# Patient Record
Sex: Female | Born: 1970 | Race: Black or African American | Hispanic: No | Marital: Single | Smoking: Former smoker
Health system: Southern US, Community
[De-identification: ages and names within clinical notes are randomized; demographics above are authoritative.]

## PROBLEM LIST (undated history)

## (undated) HISTORY — PX: TUBAL LIGATION: SHX77

---

## 2017-03-22 ENCOUNTER — Emergency Department: Payer: Self-pay

## 2017-03-22 ENCOUNTER — Encounter: Payer: Self-pay | Admitting: Emergency Medicine

## 2017-03-22 ENCOUNTER — Emergency Department
Admission: EM | Admit: 2017-03-22 | Discharge: 2017-03-22 | Disposition: A | Discharge status: Home / Self Care | Payer: Self-pay | Attending: Emergency Medicine | Admitting: Emergency Medicine

## 2017-03-22 DIAGNOSIS — F17210 Nicotine dependence, cigarettes, uncomplicated: Secondary | ICD-10-CM | POA: Insufficient documentation

## 2017-03-22 DIAGNOSIS — R079 Chest pain, unspecified: Secondary | ICD-10-CM | POA: Insufficient documentation

## 2017-03-22 DIAGNOSIS — Z5321 Procedure and treatment not carried out due to patient leaving prior to being seen by health care provider: Secondary | ICD-10-CM | POA: Insufficient documentation

## 2017-03-22 LAB — BASIC METABOLIC PANEL
ANION GAP: 9 (ref 5–15)
BUN: 16 mg/dL (ref 6–20)
CO2: 25 mmol/L (ref 22–32)
Calcium: 9 mg/dL (ref 8.9–10.3)
Chloride: 104 mmol/L (ref 101–111)
Creatinine, Ser: 0.85 mg/dL (ref 0.44–1.00)
GFR calc Af Amer: >60 mL/min (ref >60)
GFR calc non Af Amer: >60 mL/min (ref >60)
Glucose, Bld: 87 mg/dL (ref 65–99)
POTASSIUM: 3.6 mmol/L (ref 3.5–5.1)
Sodium: 138 mmol/L (ref 135–145)

## 2017-03-22 LAB — CBC
HEMATOCRIT: 41.1 % (ref 35.0–47.0)
HEMOGLOBIN: 13.7 g/dL (ref 12.0–16.0)
MCH: 28.1 pg (ref 26.0–34.0)
MCHC: 33.2 g/dL (ref 32.0–36.0)
MCV: 84.6 fL (ref 80.0–100.0)
Platelets: 238 10*3/uL (ref 150–440)
RBC: 4.86 MIL/uL (ref 3.80–5.20)
RDW: 13.6 % (ref 11.5–14.5)
WBC: 6.5 10*3/uL (ref 3.6–11.0)

## 2017-03-22 LAB — TROPONIN I: Troponin I: <0.03 ng/mL (ref <0.03)

## 2017-03-22 NOTE — ED Notes (Signed)
No answer when called from lobby 

## 2017-03-23 ENCOUNTER — Telehealth: Payer: Self-pay | Admitting: Emergency Medicine

## 2017-03-23 NOTE — Telephone Encounter (Signed)
Called patient due to lwot to inquire about condition and follow up plans. Left message.   

## 2017-07-31 ENCOUNTER — Emergency Department: Payer: Self-pay

## 2017-07-31 DIAGNOSIS — R002 Palpitations: Secondary | ICD-10-CM | POA: Insufficient documentation

## 2017-07-31 DIAGNOSIS — R0602 Shortness of breath: Secondary | ICD-10-CM | POA: Insufficient documentation

## 2017-07-31 DIAGNOSIS — Z87891 Personal history of nicotine dependence: Secondary | ICD-10-CM | POA: Insufficient documentation

## 2017-07-31 LAB — CBC
HCT: 37.3 % (ref 35.0–47.0)
Hemoglobin: 12.5 g/dL (ref 12.0–16.0)
MCH: 28.5 pg (ref 26.0–34.0)
MCHC: 33.6 g/dL (ref 32.0–36.0)
MCV: 84.9 fL (ref 80.0–100.0)
PLATELETS: 204 10*3/uL (ref 150–440)
RBC: 4.39 MIL/uL (ref 3.80–5.20)
RDW: 14.1 % (ref 11.5–14.5)
WBC: 7 10*3/uL (ref 3.6–11.0)

## 2017-07-31 LAB — TROPONIN I

## 2017-07-31 LAB — BASIC METABOLIC PANEL
ANION GAP: 7 (ref 5–15)
BUN: 14 mg/dL (ref 6–20)
CALCIUM: 8.7 mg/dL — AB (ref 8.9–10.3)
CHLORIDE: 107 mmol/L (ref 101–111)
CO2: 24 mmol/L (ref 22–32)
Creatinine, Ser: 0.74 mg/dL (ref 0.44–1.00)
GLUCOSE: 99 mg/dL (ref 65–99)
Potassium: 3.9 mmol/L (ref 3.5–5.1)
Sodium: 138 mmol/L (ref 135–145)

## 2017-07-31 NOTE — ED Notes (Signed)
Pt says she was laying down when she began feeling like her heart was racing; ambulatory with steady gait; after stat registration, pt went to the bathroom to void

## 2017-07-31 NOTE — ED Triage Notes (Signed)
Patient reports earlier episode of palpitations and SOB. Patient reports palpitations have resolved, however reports continued SOB.

## 2017-08-01 ENCOUNTER — Emergency Department
Admission: EM | Admit: 2017-08-01 | Discharge: 2017-08-01 | Disposition: A | Discharge status: Home / Self Care | Payer: Self-pay | Attending: Emergency Medicine | Admitting: Emergency Medicine

## 2017-08-01 DIAGNOSIS — R002 Palpitations: Secondary | ICD-10-CM

## 2017-08-01 LAB — FIBRIN DERIVATIVES D-DIMER (ARMC ONLY): FIBRIN DERIVATIVES D-DIMER (ARMC): 302.78 (ref 0.00–499.00)

## 2017-08-01 NOTE — ED Notes (Signed)
Per dr. Manson Passey he would like to hold pt at this time to monitor HR and ekg. Pt informed of delay for discharge. Pt assisted up to commode to void. Pt verbalizes understanding.

## 2017-08-01 NOTE — ED Provider Notes (Signed)
[facility] Emergency Department Provider Note   First MD Initiated Contact with Patient 08/01/17 0142     (approximate)  I have reviewed the triage vital signs and the nursing notes.   HISTORY  Chief Complaint Palpitations   HPI Amy Velez is a 46 y.o. female resents emergency department with history of painless palpitations and shortness of breath which occurred at 10:30 tonight. Patient states she's had previous episodes of palpitations and dyspnea however she is markedly concerned regarding the episode tonight because it lasted longer than usual. She said the palpitations were quick however the dyspnea persisted for an additional 30 minutes. Again patient denies any chest pain no dizziness no nausea or vomiting. Patient has no symptoms at present   Past medical history None There are no active problems to display for this patient.   Past Surgical History:  Procedure Laterality Date  . CESAREAN SECTION      Prior to Admission medications   Not on File    Allergies no known drug allergies No family history on file.  Social History Social History  Substance Use Topics  . Smoking status: Former Smoker    Packs/day: 1.00    Types: Cigarettes  . Smokeless tobacco: Never Used  . Alcohol use No    Review of Systems Constitutional: No fever/chills Eyes: No visual changes. ENT: No sore throat. Cardiovascular: Denies chest pain.positive for palpitations Respiratory: positive forshortness of breath. Gastrointestinal: No abdominal pain.  No nausea, no vomiting.  No diarrhea.  No constipation. Genitourinary: Negative for dysuria. Musculoskeletal: Negative for neck pain.  Negative for back pain. Integumentary: Negative for rash. Neurological: Negative for headaches, focal weakness or numbness.   ____________________________________________   PHYSICAL EXAM:  VITAL SIGNS: ED Triage Vitals  Enc Vitals Group     BP 07/31/17 2238 (!)  141/76     Pulse Rate 07/31/17 2238 64     Resp 07/31/17 2238 18     Temp 07/31/17 2238 98 F (36.7 C)     Temp Source 07/31/17 2238 Oral     SpO2 07/31/17 2238 100 %     Weight 07/31/17 2239 81.6 kg (180 lb)     Height 07/31/17 2239 1.676 m ( )     Head Circumference --      Peak Flow --      Pain Score --      Pain Loc --      Pain Edu? --      Excl. in GC? --     Constitutional: Alert and oriented. Well appearing and in no acute distress. Eyes: Conjunctivae are normal.  Head: Atraumatic. Mouth/Throat: Mucous membranes are moist.  Oropharynx non-erythematous. Neck: No stridor.   Cardiovascular: Normal rate, regular rhythm. Good peripheral circulation. Grossly normal heart sounds. Respiratory: Normal respiratory effort.  No retractions. Lungs CTAB. Gastrointestinal: Soft and nontender. No distention.  Musculoskeletal: No lower extremity tenderness nor edema. No gross deformities of extremities. Neurologic:  Normal speech and language. No gross focal neurologic deficits are appreciated.  Skin:  Skin is warm, dry and intact. No rash noted. Psychiatric: Mood and affect are normal. Speech and behavior are normal.  ____________________________________________   LABS (all labs ordered are listed, but only abnormal results are displayed)  Labs Reviewed  BASIC METABOLIC PANEL - Abnormal; Notable for the following:       Result Value   Calcium 8.7 (*)    All other components within normal limits  CBC  TROPONIN I  FIBRIN DERIVATIVES D-DIMER ([facility] ONLY)   ____________________________________________  EKG  ED ECG REPORT I, [place] N BROWN, the attending physician, personally viewed and interpreted this ECG.   Date: 08/01/2017  EKG Time: 10:32 PM  Rate: 61  Rhythm: normal sinus rhythm  Axis: normal  Intervals:Normal  ST&T Change: None  ____________________________________________  RADIOLOGY I, [place] N BROWN, personally viewed and evaluated these images (plain  radiographs) as part of my medical decision making, as well as reviewing the written report by the radiologist.  Final result by Adrian Prows, MD (07/31/17 23:10:50)           Narrative:   CLINICAL DATA: Palpitations  EXAM: CHEST 2 VIEW  COMPARISON: 03/22/2017  FINDINGS: The heart size and mediastinal contours are within normal limits. Both lungs are clear. The visualized skeletal structures are unremarkable.  IMPRESSION: No active cardiopulmonary disease.   Electronically Signed By: Jasmine Pang M.D. On: 07/31/2017 23:10            Procedures   ____________________________________________   INITIAL IMPRESSION / ASSESSMENT AND PLAN / ED COURSE  Pertinent labs & imaging results that were available during my care of the patient were reviewed by me and considered in my medical decision making (see chart for details).   46 year old female presenting with above stated history of physical exam of heart palpitation company by dyspnea. Patient normal sinus rhythm without ectopywhile in the emergency department . Patient is symptomatically at present and a such a unclear etiology for the patient's palpitation dyspnea. Consider the possibility of pulmonary emboli however d-dimer negative troponin likewise negative EKG revealed no evidence of ischemia or infarction. Patient be referred to Dr. Vennie Homans cardiologist for further outpatient evaluation      ____________________________________________  FINAL CLINICAL IMPRESSION(S) / ED DIAGNOSES  Final diagnoses:  Heart palpitations     MEDICATIONS GIVEN DURING THIS VISIT:  Medications - No data to display   NEW OUTPATIENT MEDICATIONS STARTED DURING THIS VISIT:  New Prescriptions   No medications on file    Modified Medications   No medications on file    Discontinued Medications   No medications on file     Note:  This document was prepared using Dragon voice recognition software and may  include unintentional dictation errors.    Darci Current, MD 08/01/17 [phone]

## 2017-08-01 NOTE — ED Notes (Signed)
Pt states was at rest this pm when she began to experience sensation of palpitations and "heavy beating" of her heart. Pt states that lasted "for a few beats", but pt felt short of breath for approx 30 minutes post onset of sensation. Pt denies tobacco use, bcp or hormone usage, long periods of station, cough, dizziness, diaphoresis, pain, syncope, near syncope, cough. Pt denies recent illness, fever. Pt states she has been under stress recently.

## 2017-08-01 NOTE — ED Notes (Signed)
Warm blankets provided, call bell at right side. Lights dimmed for comfort.

## 2017-10-11 IMAGING — CR DG CHEST 2V
1 series · 2 of 2 positions shown · non-contrast
Comparison: None.

CLINICAL DATA: Chest pain and shortness of Breath

EXAM:
CHEST  2 VIEW

[Series 1: w chest pa · 0.14mm/px · 2 of 2 slices shown]
[im 1/2]
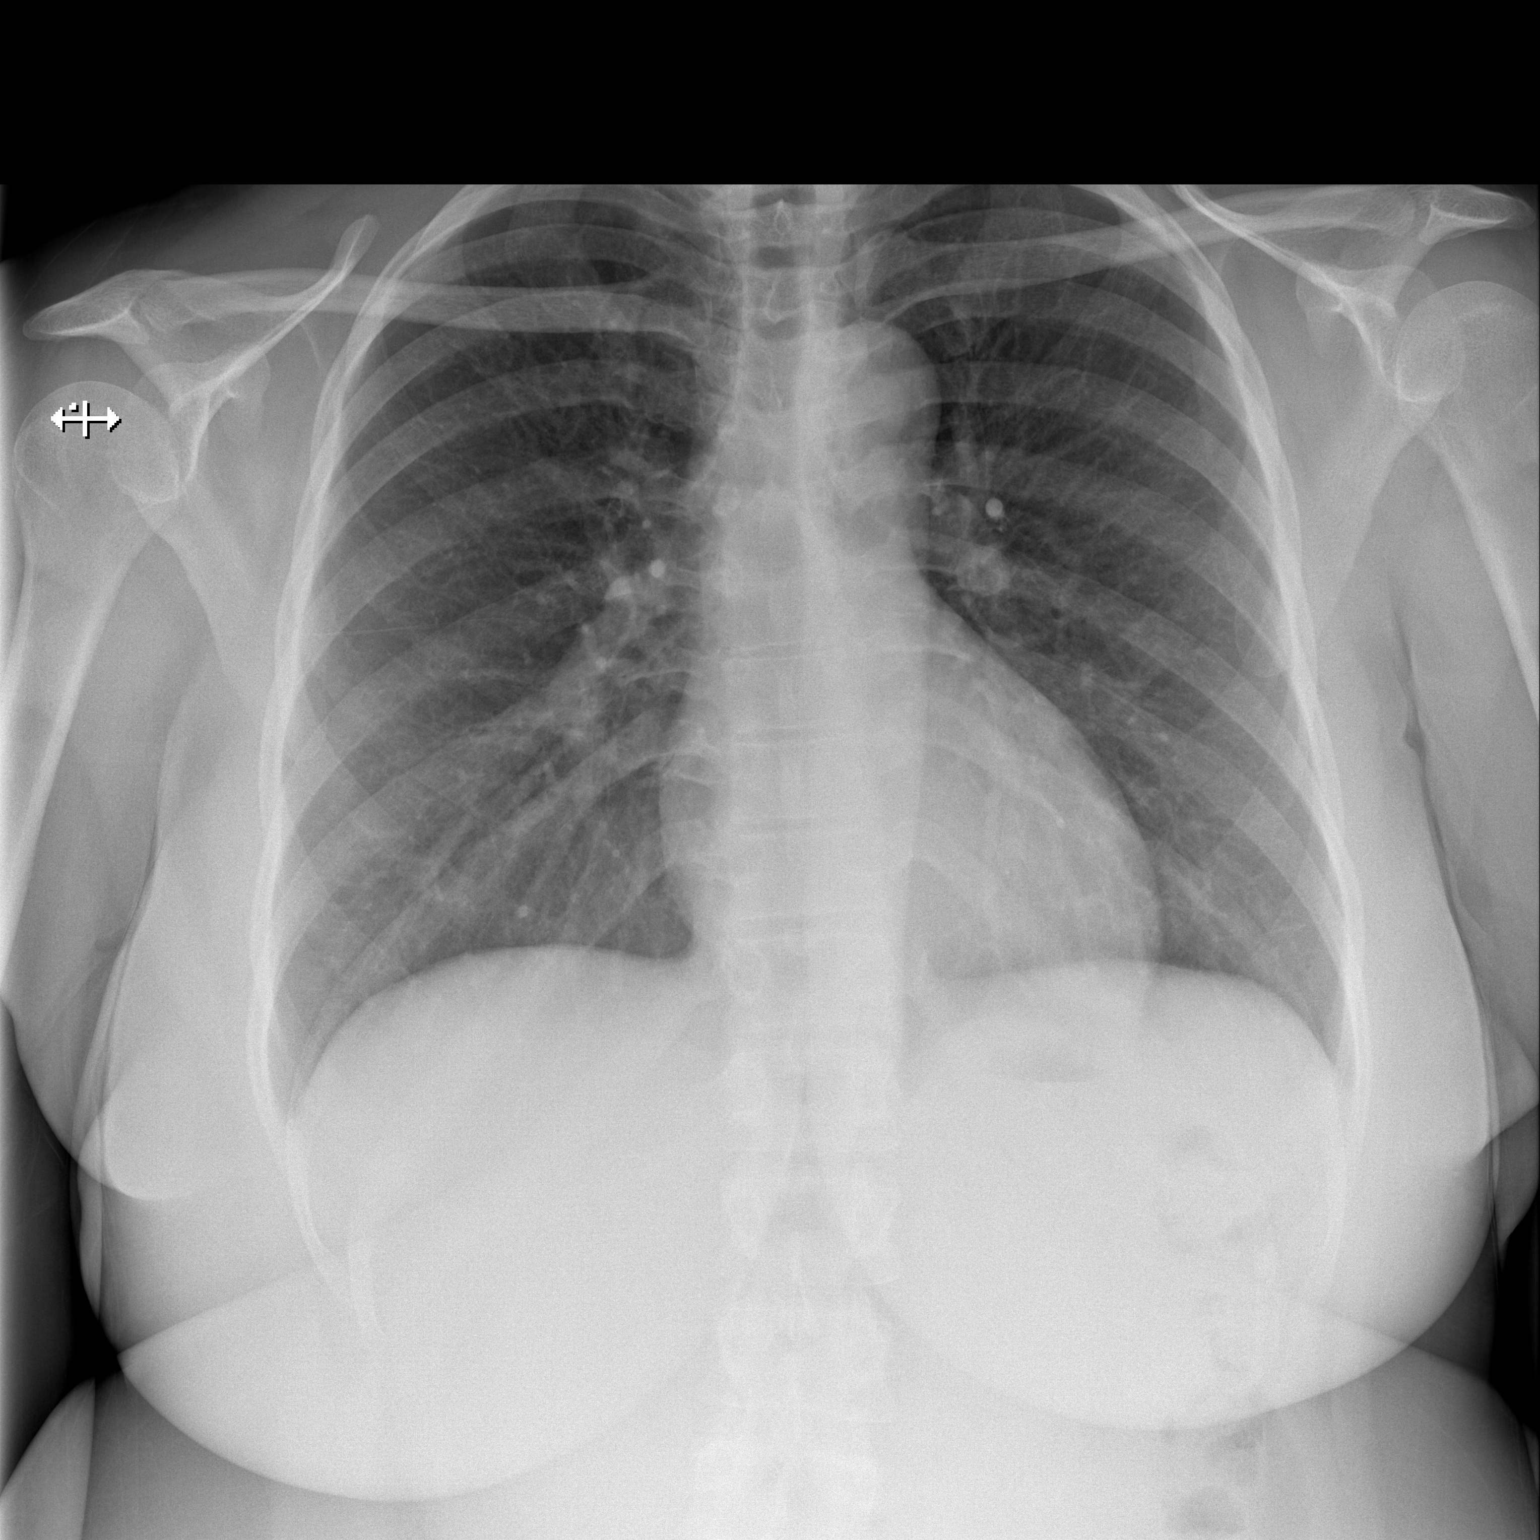
[im 2/2]
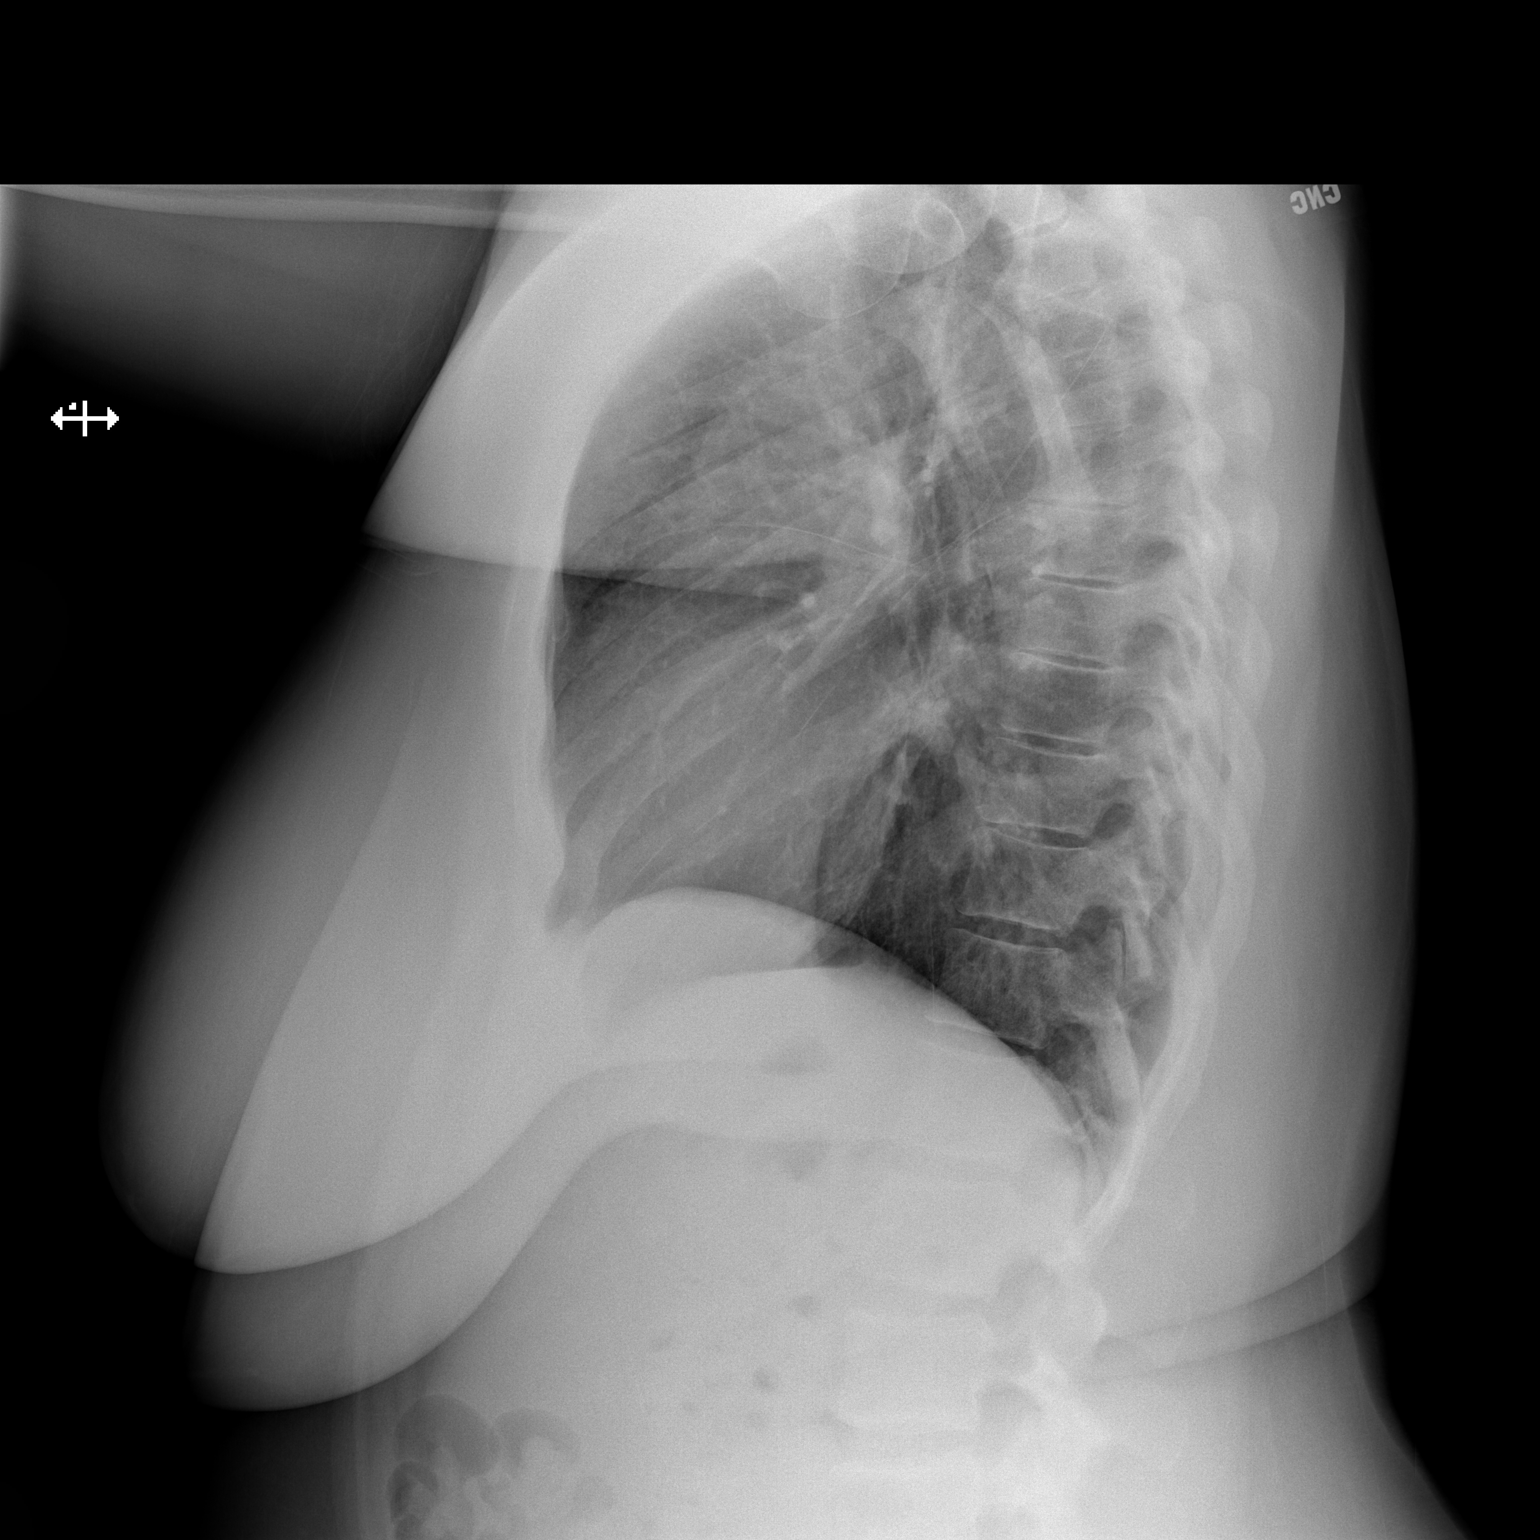

[2 of 2 positions shown; findings below may reference images not displayed]

FINDINGS: The heart size and mediastinal contours are within normal limits.
Both lungs are clear. The visualized skeletal structures are
unremarkable.
IMPRESSION: No active cardiopulmonary disease.

## 2018-01-05 ENCOUNTER — Encounter: Payer: Self-pay | Admitting: Physician Assistant

## 2018-01-05 ENCOUNTER — Ambulatory Visit: Payer: 59 | Admitting: Physician Assistant

## 2018-01-05 VITALS — BP 122/78 | HR 60 | Temp 98.4°F | Resp 16 | Ht 66.0 in | Wt 204.0 lb

## 2018-01-05 DIAGNOSIS — Z124 Encounter for screening for malignant neoplasm of cervix: Secondary | ICD-10-CM

## 2018-01-05 DIAGNOSIS — Z13220 Encounter for screening for lipoid disorders: Secondary | ICD-10-CM

## 2018-01-05 DIAGNOSIS — Z114 Encounter for screening for human immunodeficiency virus [HIV]: Secondary | ICD-10-CM

## 2018-01-05 DIAGNOSIS — Z131 Encounter for screening for diabetes mellitus: Secondary | ICD-10-CM | POA: Diagnosis not present

## 2018-01-05 DIAGNOSIS — Z0000 Encounter for general adult medical examination without abnormal findings: Secondary | ICD-10-CM

## 2018-01-05 DIAGNOSIS — Z1329 Encounter for screening for other suspected endocrine disorder: Secondary | ICD-10-CM | POA: Diagnosis not present

## 2018-01-05 DIAGNOSIS — Z720 Tobacco use: Secondary | ICD-10-CM | POA: Diagnosis not present

## 2018-01-05 DIAGNOSIS — Z1239 Encounter for other screening for malignant neoplasm of breast: Secondary | ICD-10-CM

## 2018-01-05 MED ORDER — BUPROPION HCL ER (SR) 150 MG PO TB12: ORAL_TABLET | ORAL | 0 refills | Status: DC

## 2018-01-05 NOTE — Progress Notes (Signed)
Patient: Amy Velez Female    DOB: 1970-12-01   46 y.o.   MRN: [id number] Visit Date: 01/05/2018  Today's Provider: Trey SailorsAdriana M Nalayah Hitt, PA-C   Chief Complaint  Patient presents with  . Establish Care   Subjective:    Amy Velez is a 47 y/o woman presenting today for annual physical. She is living in [place] with her husband. She has three grown children ages 6319-27 who are in school or working.   She does smoke, one pack per day and is interested in quitting.   She doesn't have an health complaints.   She reports she had a PAP smear two years ago, thinks results were normal. Doesn't remember where she got it. Sinusitis  This is a new problem. The current episode started yesterday. The problem has been gradually worsening since onset. Associated symptoms include chills, congestion, headaches and sinus pressure. Pertinent negatives include no diaphoresis, ear pain, neck pain, shortness of breath, sneezing, sore throat or swollen glands. Past treatments include nothing. The treatment provided no relief.       No Known Allergies  No current outpatient medications on file.  Review of Systems  Constitutional: Positive for chills and fatigue. Negative for activity change, appetite change, diaphoresis, fever and unexpected weight change.  HENT: Positive for congestion, sinus pressure and sinus pain. Negative for ear discharge, ear pain, nosebleeds, postnasal drip, rhinorrhea, sneezing, sore throat, tinnitus, trouble swallowing and voice change.   Eyes: Positive for discharge and itching. Negative for photophobia, pain, redness and visual disturbance.  Respiratory: Negative.  Negative for shortness of breath.   Gastrointestinal: Positive for nausea. Negative for abdominal distention, abdominal pain, anal bleeding, blood in stool, constipation, diarrhea, rectal pain and vomiting.  Musculoskeletal: Positive for myalgias. Negative for arthralgias, back pain, gait problem,  joint swelling, neck pain and neck stiffness.  Neurological: Positive for headaches. Negative for dizziness and light-headedness.    Social History   Tobacco Use  . Smoking status: Current Every Day Smoker    Packs/day: 1.00    Types: Cigarettes  . Smokeless tobacco: Never Used  Substance Use Topics  . Alcohol use: No   Objective:   BP 122/78 (BP Location: Right Arm, Patient Position: Sitting, Cuff Size: Large)   Pulse 60   Temp 98.4 F (36.9 C) (Oral)   Resp 16   Ht 5\' 6"  (1.676 m)   Wt 204 lb (92.5 kg)   LMP 12/31/2017   SpO2 98%   BMI 32.93 kg/m  Vitals:   01/05/18 0921  BP: 122/78  Pulse: 60  Resp: 16  Temp: 98.4 F (36.9 C)  TempSrc: Oral  SpO2: 98%  Weight: 204 lb (92.5 kg)  Height: 5\' 6"  (1.676 m)     Physical Exam  Constitutional: She is oriented to person, place, and time. She appears well-developed and well-nourished.  HENT:  Right Ear: External ear normal.  Left Ear: External ear normal.  Eyes: Conjunctivae are normal.  Neck: Neck supple.  Cardiovascular: Normal rate and regular rhythm.  Pulmonary/Chest: Effort normal and breath sounds normal. Right breast exhibits no inverted nipple, no mass, no nipple discharge, no skin change and no tenderness. Left breast exhibits no inverted nipple, no mass, no nipple discharge, no skin change and no tenderness. Breasts are symmetrical.  Abdominal: Soft. Bowel sounds are normal. There is no tenderness.  Genitourinary: No labial fusion. There is no rash, tenderness, lesion or injury on the right labia. There is no rash, tenderness, lesion  or injury on the left labia. Uterus is not deviated, not enlarged, not fixed and not tender. Cervix exhibits no motion tenderness, no discharge and no friability. Right adnexum displays no mass, no tenderness and no fullness. Left adnexum displays no mass, no tenderness and no fullness. No erythema, tenderness or bleeding in the vagina. No foreign body in the vagina. No signs of  injury around the vagina. Vaginal discharge found.  Lymphadenopathy:    She has no cervical adenopathy.  Neurological: She is alert and oriented to person, place, and time.  Skin: Skin is warm and dry.  Psychiatric: She has a normal mood and affect. Her behavior is normal.        Assessment & Plan:     1. Annual physical exam   2. Lipid screening  - Lipid Profile  3. Thyroid disorder screening  - TSH  4. Encounter for screening for HIV  - HIV antibody (with reflex)  5. Diabetes mellitus screening  - Comprehensive Metabolic Panel (CMET)  6. Breast cancer screening  - MM Digital Screening; Future  7. Cervical cancer screening  - Pap IG and HPV (high risk) DNA detection  8. Tobacco abuse  Counseled on smoking cessation for >3 min.  - buPROPion (WELLBUTRIN SR) 150 MG 12 hr tablet; Take one pill daily for three days. On day four, take one pill twice daily for remaining course.  Dispense: 180 tablet; Refill: 0  Return in about 1 year (around 01/05/2019).  The entirety of the information documented in the History of Present Illness, Review of Systems and Physical Exam were personally obtained by me. Portions of this information were initially documented by Kavin Leech, CMA and reviewed by me for thoroughness and accuracy.          Trey Sailors, PA-C  [facility]

## 2018-01-05 NOTE — Patient Instructions (Signed)

## 2018-01-06 ENCOUNTER — Telehealth: Payer: Self-pay

## 2018-01-06 DIAGNOSIS — Z720 Tobacco use: Secondary | ICD-10-CM | POA: Insufficient documentation

## 2018-01-06 LAB — COMPREHENSIVE METABOLIC PANEL
ALT: 13 IU/L (ref 0–32)
AST: 16 IU/L (ref 0–40)
Albumin/Globulin Ratio: 1.5 (ref 1.2–2.2)
Albumin: 4.2 g/dL (ref 3.5–5.5)
Alkaline Phosphatase: 58 IU/L (ref 39–117)
BUN/Creatinine Ratio: 15 (ref 9–23)
BUN: 10 mg/dL (ref 6–24)
Bilirubin Total: 0.3 mg/dL (ref 0.0–1.2)
CO2: 22 mmol/L (ref 20–29)
Calcium: 9.2 mg/dL (ref 8.7–10.2)
Chloride: 103 mmol/L (ref 96–106)
Creatinine, Ser: 0.66 mg/dL (ref 0.57–1.00)
GFR calc Af Amer: 123 mL/min/{1.73_m2} (ref >59)
GFR calc non Af Amer: 107 mL/min/{1.73_m2} (ref >59)
Globulin, Total: 2.8 g/dL (ref 1.5–4.5)
Glucose: 94 mg/dL (ref 65–99)
Potassium: 4.6 mmol/L (ref 3.5–5.2)
Sodium: 140 mmol/L (ref 134–144)
Total Protein: 7 g/dL (ref 6.0–8.5)

## 2018-01-06 LAB — LIPID PANEL
Chol/HDL Ratio: 2.4 ratio (ref 0.0–4.4)
Cholesterol, Total: 133 mg/dL (ref 100–199)
HDL: 56 mg/dL (ref >39)
LDL Calculated: 67 mg/dL (ref 0–99)
Triglycerides: 50 mg/dL (ref 0–149)
VLDL Cholesterol Cal: 10 mg/dL (ref 5–40)

## 2018-01-06 LAB — TSH: TSH: 0.536 u[IU]/mL (ref 0.450–4.500)

## 2018-01-06 LAB — HIV ANTIBODY (ROUTINE TESTING W REFLEX): HIV Screen 4th Generation wRfx: NONREACTIVE

## 2018-01-06 NOTE — Telephone Encounter (Signed)
-----   Message from Trey SailorsAdriana M Pollak, [place]-C sent at 01/06/2018  8:34 AM EST ----- Labwork all normal.

## 2018-01-06 NOTE — Telephone Encounter (Signed)
Pt advised.   Thanks,   -Laura  

## 2018-01-07 LAB — PAP IG AND HPV HIGH-RISK
HPV, high-risk: NEGATIVE
PAP Smear Comment: 0

## 2018-01-10 ENCOUNTER — Telehealth: Payer: Self-pay

## 2018-01-10 NOTE — Telephone Encounter (Signed)
LMTCB 01/10/2018  Thanks,   -Gordie Belvin  

## 2018-01-10 NOTE — Telephone Encounter (Signed)
-----   Message from Trey SailorsAdriana M Pollak, [place] sent at 01/10/2018  3:17 PM EST ----- PAP is normal and HPV negative. Repeat 5 years.

## 2018-01-11 NOTE — Telephone Encounter (Signed)
Pt advised.   Thanks,   -Laura  

## 2018-02-28 ENCOUNTER — Other Ambulatory Visit: Payer: Self-pay | Admitting: Physician Assistant

## 2018-02-28 DIAGNOSIS — Z720 Tobacco use: Secondary | ICD-10-CM

## 2018-03-02 ENCOUNTER — Telehealth: Payer: Self-pay | Admitting: Physician Assistant

## 2018-03-02 NOTE — Telephone Encounter (Signed)
[facility] pharmacy faxed a refill request for the following medication. Thanks CC  buPROPion (WELLBUTRIN SR) 150 MG 12 hr tablet

## 2018-04-26 ENCOUNTER — Ambulatory Visit: Payer: 59 | Admitting: Physician Assistant

## 2018-04-26 ENCOUNTER — Encounter: Payer: Self-pay | Admitting: Physician Assistant

## 2018-04-26 VITALS — BP 116/76 | HR 60 | Temp 98.6°F | Resp 16 | Wt 213.0 lb

## 2018-04-26 DIAGNOSIS — R29818 Other symptoms and signs involving the nervous system: Secondary | ICD-10-CM | POA: Diagnosis not present

## 2018-04-26 DIAGNOSIS — Z720 Tobacco use: Secondary | ICD-10-CM

## 2018-04-26 DIAGNOSIS — R0681 Apnea, not elsewhere classified: Secondary | ICD-10-CM

## 2018-04-26 DIAGNOSIS — R51 Headache: Secondary | ICD-10-CM

## 2018-04-26 DIAGNOSIS — R519 Headache, unspecified: Secondary | ICD-10-CM

## 2018-04-26 DIAGNOSIS — R252 Cramp and spasm: Secondary | ICD-10-CM | POA: Diagnosis not present

## 2018-04-26 NOTE — Progress Notes (Signed)
Patient: Amy Velez Female    DOB: February 25, 1971   47 y.o.   MRN: [id number] Visit Date: 04/29/2018  Today's Provider: Trey SailorsAdriana M Siriah Treat, PA-C   Chief Complaint  Patient presents with  . Headache    Has been going on for several months   Subjective:    Amy Velez is a 47 y/o woman with history of tobacco abuse presenting today for multiple complaints. The first of which are her headaches, which have been lifelong but particularly bothersome in the last few months. She has been seen for this on multiple occasions in the ER, most recently on [facility]09/[facility] at [facility]. She underwent head CT which was normal. Labwork unremarkable. Migraine cocktail provided relief. She does not note a significant change in headaches, does not describe migrainous features.  She also has concerns about leg cramps. They happen intermittently and are not related to activity and are not relieved with rest.  She also wants to know why she has dark eye bags.   Additionally, reports fatigue. She reports sleeping normal hours but not being rested. She says her husband reports she snores and even stops breathing at night while she sleeps. Reports daytime fatigue, propensity to dose off in the daytime. [place] completed today.   She has googled her symptoms and is worried her smoking has caused poor circulation that is causing her headaches, dark eye bags, and leg cramping. She continues to smoke one pack per day. She has been smoking one pack per day since  ~age 59. Last visit she was prescribed wellbutrin which she says did not work. She says she tried chantix previously and it did not work.   Headache   This is a chronic problem. Episode frequency: Pt reports getting a headached every other day.  The problem has been unchanged. The pain is located in the frontal region. The pain does not radiate. The pain quality is not similar to prior headaches. The quality of the pain is described as dull. Associated symptoms  include numbness (Left hand occasionally), photophobia and sinus pressure. Pertinent negatives include no abdominal pain, anorexia, blurred vision, dizziness, ear pain, eye pain, eye redness, eye watering, fever, phonophobia, rhinorrhea, seizures, sore throat or tinnitus.       No Known Allergies   Current Outpatient Medications:  .  buPROPion (WELLBUTRIN SR) 150 MG 12 hr tablet, TAKE 1 TABLET BY MOUTH  DAILY FOR 3 DAYS. ON DAY 4  TAKE 1 TABLET 2 TIMES DAILY FOR REMAINING COURSE., Disp: 180 tablet, Rfl: 0  Review of Systems  Constitutional: Positive for diaphoresis and fatigue. Negative for activity change, appetite change, chills, fever and unexpected weight change.  HENT: Positive for sinus pressure. Negative for congestion, ear discharge, ear pain, postnasal drip, rhinorrhea, sore throat, tinnitus, trouble swallowing and voice change.   Eyes: Positive for photophobia and itching. Negative for blurred vision, pain, discharge, redness and visual disturbance.  Gastrointestinal: Negative.  Negative for abdominal pain and anorexia.  Neurological: Positive for light-headedness, numbness (Left hand occasionally) and headaches. Negative for dizziness, seizures, syncope and speech difficulty.  Hematological: Does not bruise/bleed easily.    Social History   Tobacco Use  . Smoking status: Current Every Day Smoker    Packs/day: 1.00    Types: Cigarettes  . Smokeless tobacco: Never Used  Substance Use Topics  . Alcohol use: No   Objective:   BP 116/76 (BP Location: Right Arm, Patient Position: Sitting, Cuff Size: Large)   Pulse 60  Temp 98.6 F (37 C) (Oral)   Resp 16   Wt 213 lb (96.6 kg)   BMI 34.38 kg/m  Vitals:   04/26/18 1630  BP: 116/76  Pulse: 60  Resp: 16  Temp: 98.6 F (37 C)  TempSrc: Oral  Weight: 213 lb (96.6 kg)     Physical Exam  Constitutional: She is oriented to person, place, and time. She appears well-developed and well-nourished.  HENT:  Head:  Normocephalic and atraumatic.  Mouth/Throat: Oropharynx is clear and moist.  Eyes: Pupils are equal, round, and reactive to light. EOM are normal.  Neck: Neck supple.  Cardiovascular: Normal rate, regular rhythm, normal heart sounds and intact distal pulses.  Pulses:      Dorsalis pedis pulses are 2+ on the right side, and 2+ on the left side.       Posterior tibial pulses are 2+ on the right side, and 2+ on the left side.  Pulmonary/Chest: Effort normal and breath sounds normal.  Lymphadenopathy:    She has no cervical adenopathy.  Neurological: She is alert and oriented to person, place, and time. She has normal strength. GCS eye subscore is 4. GCS verbal subscore is 5. GCS motor subscore is 6.  Skin: Skin is warm and dry.  Psychiatric: She has a normal mood and affect. Her behavior is normal.        Assessment & Plan:     1. Nonintractable headache, unspecified chronicity pattern, unspecified headache type  No red flag symptoms, CT head negative 11/2017. She is concerned that her smoking has contributed to her headaches, leg cramps, and under eye bags. Have gone over that smoking indeed is poor for her circulation, but I don't think this is the cause of her symotms. Her symptoms do not align with claudication, CT head negative. Think her symptoms may be related to undiagnosed sleep apnea.  2. Tobacco abuse  Needs to stop smoking. Said wellbutrin and chantix didn't work.   3. Leg cramps  Intermittent, random. Do not seem to be consistent with claudication.  4. Witnessed episode of apnea  - Home sleep test  5. Suspected sleep apnea  - Home sleep test  Return if symptoms worsen or fail to improve.  The entirety of the information documented in the History of Present Illness, Review of Systems and Physical Exam were personally obtained by me. Portions of this information were initially documented by Kavin Leech, CMA and reviewed by me for thoroughness and accuracy.   I have  spent 25 minutes with this patient, >50% of which was spent on counseling and coordination of care.        Trey Sailors, PA-C  [facility]

## 2018-04-27 DIAGNOSIS — R519 Headache, unspecified: Secondary | ICD-10-CM | POA: Insufficient documentation

## 2018-04-27 DIAGNOSIS — R51 Headache: Secondary | ICD-10-CM

## 2018-04-28 NOTE — Patient Instructions (Signed)

## 2018-05-24 ENCOUNTER — Encounter: Payer: Self-pay | Admitting: Physician Assistant

## 2018-06-03 ENCOUNTER — Other Ambulatory Visit: Payer: Self-pay | Admitting: Physician Assistant

## 2018-06-03 DIAGNOSIS — G4733 Obstructive sleep apnea (adult) (pediatric): Secondary | ICD-10-CM

## 2018-06-07 ENCOUNTER — Telehealth: Payer: Self-pay | Admitting: Physician Assistant

## 2018-06-07 NOTE — Telephone Encounter (Signed)
Can we reschedule her 06/27/2018 appointment? It is too early to be a CPAP compliance follow up. Suggest she follow up in 2 months. Did she get her CPAP equipment?

## 2018-06-09 NOTE — Telephone Encounter (Signed)
LMTCB Patient has a appointment scheduled on 06/27/18 for CPAP compliance follow up. Appointment needs to be rescheduled if it has not been 2 months since she received her CPAP machine.

## 2018-06-10 NOTE — Telephone Encounter (Signed)
Pt states she has not received her CPAP and is not really sure what to do get get her equipment.  She has cxed her appt for 06/27/18 and would like some instructions on what to do next regarding getting her equipment.

## 2018-06-27 ENCOUNTER — Ambulatory Visit: Payer: Self-pay | Admitting: Physician Assistant

## 2018-07-18 ENCOUNTER — Encounter: Payer: Self-pay | Admitting: Family Medicine

## 2018-07-18 ENCOUNTER — Ambulatory Visit: Payer: 59 | Admitting: Family Medicine

## 2018-07-18 VITALS — BP 100/70 | HR 45 | Temp 97.9°F | Resp 15 | Wt 204.4 lb

## 2018-07-18 DIAGNOSIS — R1032 Left lower quadrant pain: Secondary | ICD-10-CM | POA: Diagnosis not present

## 2018-07-18 LAB — POCT URINALYSIS DIPSTICK
BILIRUBIN UA: NEGATIVE
Glucose, UA: NEGATIVE
KETONES UA: NEGATIVE
Leukocytes, UA: NEGATIVE
Nitrite, UA: NEGATIVE
PH UA: 7.5 (ref 5.0–8.0)
Protein, UA: NEGATIVE
Spec Grav, UA: 1.01 (ref 1.010–1.025)
Urobilinogen, UA: 0.2 E.U./dL

## 2018-07-18 LAB — POCT URINE PREGNANCY: Preg Test, Ur: NEGATIVE

## 2018-07-18 NOTE — Patient Instructions (Signed)
Add Miralax to make sure your bowels are moving. Use Aleve or ibuprofen for abdominal discomfort. We will call about the ultrasound schedule.

## 2018-07-18 NOTE — Progress Notes (Signed)
  Subjective:     Patient ID: Amy Velez, female   DOB: July 30, 1971, 47 y.o.   MRN: [id number] Chief Complaint  Patient presents with  . Abdominal Pain    Patient comes in office today with complaints of LLQ pain for the past 2 days described as a dull and constant ache. Patient reports symptoms of nausea and constipation associated.    HPI  States she last moved her bowels yesterday. No fever/chills. No hx of diverticulitis. Reports her menstrual periods are irregular and did not have one last month. Hx of Tubal ligation.  Review of Systems     Objective:   Physical Exam  Constitutional: She appears well-developed and well-nourished. No distress.  Abdominal: Bowel sounds are normal. There is tenderness (mild/generalized to LLQ). There is no guarding.       Assessment:    1. Left lower quadrant pain - POCT urine pregnancy - POCT urinalysis dipstick - US Transvaginal Non-OB; Future - US Pelvis Complete; Future    Plan:    Discussed use of Miralax and nsaid's for pain. Work excuse for today. Further f/u pending ultrasound. Suggested she may cancel ultrasound if pain resolves with further bowel movements.

## 2018-07-20 ENCOUNTER — Ambulatory Visit: Admission: RE | Admit: 2018-07-20 | Payer: 59 | Source: Ambulatory Visit

## 2018-07-20 ENCOUNTER — Telehealth: Payer: Self-pay | Admitting: *Deleted

## 2018-07-20 NOTE — Telephone Encounter (Signed)
FYI! Patient no showed her appointment for US pelvis today.

## 2018-07-20 NOTE — Telephone Encounter (Signed)
Please advise. KW 

## 2018-09-23 ENCOUNTER — Ambulatory Visit: Payer: 59 | Admitting: Physician Assistant

## 2018-09-23 ENCOUNTER — Encounter: Payer: Self-pay | Admitting: Physician Assistant

## 2018-09-23 VITALS — BP 120/80 | HR 60 | Temp 98.4°F | Resp 16 | Wt 200.0 lb

## 2018-09-23 DIAGNOSIS — L304 Erythema intertrigo: Secondary | ICD-10-CM | POA: Diagnosis not present

## 2018-09-23 DIAGNOSIS — Z23 Encounter for immunization: Secondary | ICD-10-CM

## 2018-09-23 MED ORDER — MICONAZOLE NITRATE 2 % EX CREA: 1.0000 "application " | TOPICAL_CREAM | Freq: Two times a day (BID) | CUTANEOUS | 0 refills | Status: AC

## 2018-09-23 NOTE — Patient Instructions (Signed)
Intertrigo Intertrigo is skin irritation (inflammation) that happens in warm, moist areas of the body. The irritation can cause a rash and make skin raw and itchy. The rash is usually pink or red. It happens mostly between folds of skin or where skin rubs together, such as:  Toes.  Armpits.  Groin.  Belly.  Breasts.  Buttocks.  This condition is not passed from person to person (is not contagious). Follow these instructions at home:  Keep the affected area clean and dry.  Do not scratch your skin.  Stay cool as much as possible. Use an air conditioner or fan, if you can.  Apply over-the-counter and prescription medicines only as told by your doctor.  If you were prescribed an antibiotic medicine, use it as told by your doctor. Do not stop using the antibiotic even if your condition starts to get better.  Keep all follow-up visits as told by your doctor. This is important. How is this prevented?  Stay at a healthy weight.  Keep your feet dry. This is very important if you have diabetes. Wear cotton or wool socks.  Take care of and protect the skin in your groin and butt area as told by your doctor.  Do not wear tight clothes. Wear clothes that: ? Are loose. ? Take away moisture from your body. ? Are made of cotton.  Wear a bra that gives good support, if needed.  Shower and dry yourself fully after being active.  Keep your blood sugar under control if you have diabetes. Contact a doctor if:  Your symptoms do not get better with treatment.  Your symptoms get worse or they spread.  You notice more redness and warmth.  You have a fever. This information is not intended to replace advice given to you by your health care provider. Make sure you discuss any questions you have with your health care provider. Document Released: 11/28/2010 Document Revised: 04/02/2016 Document Reviewed: 04/29/2015 Elsevier Interactive Patient Education  2018 [employer]  

## 2018-09-23 NOTE — Progress Notes (Signed)
Patient: Amy Velez Female    DOB: 11-22-1970   47 y.o.   MRN: [id number] Visit Date: 09/23/2018  Today's Provider: Trinna Post, PA-C   Chief Complaint  Patient presents with  . Rash   Subjective:    HPI Rash: Patient complains of rash involving the underarms. Rash started 3 week ago. Appearance of rash at onset: Other appearance: . Rash has changed over time Initial distribution: worsening.  Discomfort associated with rash: is pruritic.  Associated symptoms: none. Denies: fever and sore throat. Patient has not had previous evaluation of rash. Patient reports she tried hydrocortisone cream and this made the rash worse. Patient also provided desitin cream, which did not help.  Response to treatment: none. Patient has not had contacts with similar rash. Patient has not identified precipitant. Patient has not had new exposures (soaps, lotions, laundry detergents, foods, medications, plants, insects or animals.)  Sleep Apnea: Has been diagnosed with Sleep apnea but has been unable to get CPAP as the CPAP costs 200 dollars and she has not met her deductible.     No Known Allergies   Current Outpatient Medications:  .  miconazole (MICOTIN) 2 % cream, Apply 1 application topically 2 (two) times daily., Disp: 28.35 g, Rfl: 0  Review of Systems  Constitutional: Negative.   Cardiovascular: Negative.   Skin: Positive for color change and rash.    Social History   Tobacco Use  . Smoking status: Current Every Day Smoker    Packs/day: 1.00    Types: Cigarettes  . Smokeless tobacco: Never Used  Substance Use Topics  . Alcohol use: No   Objective:   BP 120/80 (BP Location: Left Arm, Patient Position: Sitting, Cuff Size: Normal)   Pulse 60   Temp 98.4 F (36.9 C) (Oral)   Resp 16   Wt 200 lb (90.7 kg)   SpO2 99%   BMI 32.28 kg/m  Vitals:   09/23/18 0855  BP: 120/80  Pulse: 60  Resp: 16  Temp: 98.4 F (36.9 C)  TempSrc: Oral  SpO2: 99%  Weight: 200 lb (90.7  kg)     Physical Exam  Constitutional: She is oriented to person, place, and time. She appears well-developed and well-nourished.  Cardiovascular: Normal rate.  Pulmonary/Chest: Effort normal.  Neurological: She is alert and oriented to person, place, and time.  Skin: Skin is warm and dry. Rash noted.  There is hyperpigmented skin under bilateral axilla and both breasts. There are also papules in these location.   Psychiatric: She has a normal mood and affect. Her behavior is normal.        Assessment & Plan:     1. Intertrigo  - miconazole (MICOTIN) 2 % cream; Apply 1 application topically 2 (two) times daily.  Dispense: 28.35 g; Refill: 0  2. Need for influenza vaccination  - Flu Vaccine QUAD 36+ mos IM  Return if symptoms worsen or fail to improve.  The entirety of the information documented in the History of Present Illness, Review of Systems and Physical Exam were personally obtained by me. Portions of this information were initially documented by Lynford Humphrey, CMA and reviewed by me for thoroughness and accuracy.        Trinna Post, PA-C  [facility]

## 2018-09-25 DIAGNOSIS — Z5321 Procedure and treatment not carried out due to patient leaving prior to being seen by health care provider: Secondary | ICD-10-CM | POA: Diagnosis not present

## 2018-09-25 DIAGNOSIS — M542 Cervicalgia: Secondary | ICD-10-CM | POA: Insufficient documentation

## 2018-09-25 DIAGNOSIS — M79601 Pain in right arm: Secondary | ICD-10-CM | POA: Diagnosis not present

## 2018-09-25 LAB — CBC
HCT: 41.4 % (ref 36.0–46.0)
HEMOGLOBIN: 13.2 g/dL (ref 12.0–15.0)
MCH: 27.8 pg (ref 26.0–34.0)
MCHC: 31.9 g/dL (ref 30.0–36.0)
MCV: 87.3 fL (ref 80.0–100.0)
PLATELETS: 243 10*3/uL (ref 150–400)
RBC: 4.74 MIL/uL (ref 3.87–5.11)
RDW: 14.7 % (ref 11.5–15.5)
WBC: 5.8 10*3/uL (ref 4.0–10.5)
nRBC: 0 % (ref 0.0–0.2)

## 2018-09-25 LAB — COMPREHENSIVE METABOLIC PANEL
ALK PHOS: 53 U/L (ref 38–126)
ALT: 13 U/L (ref 0–44)
ANION GAP: 7 (ref 5–15)
AST: 16 U/L (ref 15–41)
Albumin: 3.7 g/dL (ref 3.5–5.0)
BUN: 21 mg/dL — ABNORMAL HIGH (ref 6–20)
CHLORIDE: 108 mmol/L (ref 98–111)
CO2: 24 mmol/L (ref 22–32)
Calcium: 8.7 mg/dL — ABNORMAL LOW (ref 8.9–10.3)
Creatinine, Ser: 0.73 mg/dL (ref 0.44–1.00)
GFR calc Af Amer: >60 mL/min (ref >60)
GFR calc non Af Amer: >60 mL/min (ref >60)
Glucose, Bld: 104 mg/dL — ABNORMAL HIGH (ref 70–99)
POTASSIUM: 3.8 mmol/L (ref 3.5–5.1)
SODIUM: 139 mmol/L (ref 135–145)
Total Bilirubin: 0.4 mg/dL (ref 0.3–1.2)
Total Protein: 6.5 g/dL (ref 6.5–8.1)

## 2018-09-25 NOTE — ED Triage Notes (Signed)
Patient c/o right neck pain, and right arm pain described as numbness beginning at 0900 today. Patient reports hx of the same in the past. Patient reports the pain has never been this severe before. Patient denies injury to area.

## 2018-09-26 ENCOUNTER — Telehealth: Payer: Self-pay | Admitting: Emergency Medicine

## 2018-09-26 ENCOUNTER — Emergency Department
Admission: EM | Admit: 2018-09-26 | Discharge: 2018-09-26 | Disposition: A | Discharge status: Home / Self Care | Payer: 59 | Attending: Emergency Medicine | Admitting: Emergency Medicine

## 2018-09-26 NOTE — ED Notes (Signed)
RN Elon JesterMichele called for pt 3 times for treatment room with no answer;

## 2018-09-26 NOTE — Telephone Encounter (Signed)
Called patient due to lwot to inquire about condition and follow up plans. Left message.   

## 2018-10-26 ENCOUNTER — Ambulatory Visit: Payer: Self-pay | Admitting: Obstetrics and Gynecology

## 2018-10-27 DIAGNOSIS — Z1231 Encounter for screening mammogram for malignant neoplasm of breast: Secondary | ICD-10-CM | POA: Diagnosis not present

## 2018-10-27 DIAGNOSIS — Z01419 Encounter for gynecological examination (general) (routine) without abnormal findings: Secondary | ICD-10-CM | POA: Diagnosis not present

## 2019-01-13 ENCOUNTER — Encounter: Payer: Self-pay | Admitting: Physician Assistant

## 2019-01-13 ENCOUNTER — Ambulatory Visit: Payer: 59 | Admitting: Physician Assistant

## 2019-01-13 VITALS — BP 127/81 | HR 68 | Temp 98.7°F | Wt 201.6 lb

## 2019-01-13 DIAGNOSIS — R110 Nausea: Secondary | ICD-10-CM

## 2019-01-13 DIAGNOSIS — R109 Unspecified abdominal pain: Secondary | ICD-10-CM | POA: Diagnosis not present

## 2019-01-13 MED ORDER — ONDANSETRON HCL 4 MG PO TABS: 4.0000 mg | ORAL_TABLET | Freq: Three times a day (TID) | ORAL | 0 refills | Status: AC | PRN

## 2019-01-13 NOTE — Patient Instructions (Signed)
Pepcid 20 mg twice daily 30 minutes before meals TUMS as needed miralax capful in drink of choice     Abdominal Pain, Adult  Many things can cause belly (abdominal) pain. Most times, belly pain is not dangerous. Many cases of belly pain can be watched and treated at home. Sometimes belly pain is serious, though. Your doctor will try to find the cause of your belly pain. Follow these instructions at home:  Take over-the-counter and prescription medicines only as told by your doctor. Do not take medicines that help you poop (laxatives) unless told to by your doctor.  Drink enough fluid to keep your pee (urine) clear or pale yellow.  Watch your belly pain for any changes.  Keep all follow-up visits as told by your doctor. This is important. Contact a doctor if:  Your belly pain changes or gets worse.  You are not hungry, or you lose weight without trying.  You are having trouble pooping (constipated) or have watery poop (diarrhea) for more than 2-3 days.  You have pain when you pee or poop.  Your belly pain wakes you up at night.  Your pain gets worse with meals, after eating, or with certain foods.  You are throwing up and cannot keep anything down.  You have a fever. Get help right away if:  Your pain does not go away as soon as your doctor says it should.  You cannot stop throwing up.  Your pain is only in areas of your belly, such as the right side or the left lower part of the belly.  You have bloody or black poop, or poop that looks like tar.  You have very bad pain, cramping, or bloating in your belly.  You have signs of not having enough fluid or water in your body (dehydration), such as: ? Dark pee, very little pee, or no pee. ? Cracked lips. ? Dry mouth. ? Sunken eyes. ? Sleepiness. ? Weakness. This information is not intended to replace advice given to you by your health care provider. Make sure you discuss any questions you have with your health care  provider. Document Released: 04/13/2008 Document Revised: 05/15/2016 Document Reviewed: 04/08/2016 Elsevier Interactive Patient Education  2019 [employer].

## 2019-01-13 NOTE — Progress Notes (Signed)
Patient: Amy Velez Female    DOB: 08-21-1971   48 y.o.   MRN: [id number] Visit Date: 01/13/2019  Today's Provider: Trey Sailors, PA-C   Chief Complaint  Patient presents with  . Abdominal Pain   Subjective:     Abdominal Pain  This is a new problem. The current episode started in the past 7 days. The problem occurs constantly. The problem has been unchanged. The pain is located in the epigastric region. The pain is at a severity of 5/10. The pain is moderate. The quality of the pain is cramping. The abdominal pain does not radiate. Associated symptoms include nausea. Nothing aggravates the pain.   Intermittent cramping, reports this is not related to food. Alka seltzer helped symptoms. Has traveled on a flight to [place] and is worried she has coronavirus. Has no respiratory symptoms including cough, shortness of breath. Denies fevers, chills.    No Known Allergies   Current Outpatient Medications:  .  miconazole (MICOTIN) 2 % cream, Apply 1 application topically 2 (two) times daily., Disp: 28.35 g, Rfl: 0  Review of Systems  HENT: Negative.   Respiratory: Negative.   Gastrointestinal: Positive for abdominal pain and nausea.  Genitourinary: Negative.   Musculoskeletal: Negative.   Neurological: Negative.     Social History   Tobacco Use  . Smoking status: Former Smoker    Packs/day: 1.00    Types: Cigarettes  . Smokeless tobacco: Never Used  Substance Use Topics  . Alcohol use: No      Objective:   BP 127/81 (BP Location: Left Arm, Patient Position: Sitting, Cuff Size: Normal)   Pulse 68   Temp 98.7 F (37.1 C) (Oral)   Wt 201 lb 9.6 oz (91.4 kg)   LMP 12/24/2018 (Exact Date)   SpO2 97%   BMI 32.54 kg/m  Vitals:   01/13/19 1112  BP: 127/81  Pulse: 68  Temp: 98.7 F (37.1 C)  TempSrc: Oral  SpO2: 97%  Weight: 201 lb 9.6 oz (91.4 kg)     Physical Exam Constitutional:      Appearance: She is well-developed.  Cardiovascular:   Rate and Rhythm: Normal rate and regular rhythm.  Pulmonary:     Effort: Pulmonary effort is normal.     Breath sounds: Normal breath sounds.  Abdominal:     General: Abdomen is flat. Bowel sounds are normal.     Palpations: Abdomen is soft.     Tenderness: There is no abdominal tenderness.  Genitourinary:    Adnexa: Right adnexa normal and left adnexa normal.  Neurological:     General: No focal deficit present.     Mental Status: She is alert.  Psychiatric:        Mood and Affect: Mood normal.        Behavior: Behavior normal.         Assessment & Plan    1. Abdominal pain, unspecified abdominal location  Suspect gastroenteritis or GERD. Advised treatment with PPIs and nausea medication. Do not think she has the coronavirus, explained this is a respiratory illness and should not cause isolated GI symptoms.  2. Nausea  - ondansetron (ZOFRAN) 4 MG tablet; Take 1 tablet (4 mg total) by mouth every 8 (eight) hours as needed for nausea or vomiting.  Dispense: 20 tablet; Refill: 0  The entirety of the information documented in the History of Present Illness, Review of Systems and Physical Exam were personally obtained by me. Portions of  this information were initially documented by Hetty Ely, CMA and reviewed by me for thoroughness and accuracy.   Follow up PRN.     Trey Sailors, PA-C  [facility]

## 2019-12-11 ENCOUNTER — Encounter: Payer: Self-pay | Admitting: Emergency Medicine

## 2019-12-11 ENCOUNTER — Emergency Department: Payer: Self-pay

## 2019-12-11 ENCOUNTER — Other Ambulatory Visit: Payer: Self-pay

## 2019-12-11 DIAGNOSIS — Z5321 Procedure and treatment not carried out due to patient leaving prior to being seen by health care provider: Secondary | ICD-10-CM | POA: Insufficient documentation

## 2019-12-11 DIAGNOSIS — R002 Palpitations: Secondary | ICD-10-CM | POA: Insufficient documentation

## 2019-12-11 DIAGNOSIS — R0602 Shortness of breath: Secondary | ICD-10-CM | POA: Insufficient documentation

## 2019-12-11 LAB — CBC
HCT: 45.2 % (ref 36.0–46.0)
Hemoglobin: 14.9 g/dL (ref 12.0–15.0)
MCH: 28.2 pg (ref 26.0–34.0)
MCHC: 33 g/dL (ref 30.0–36.0)
MCV: 85.6 fL (ref 80.0–100.0)
Platelets: 266 10*3/uL (ref 150–400)
RBC: 5.28 MIL/uL — ABNORMAL HIGH (ref 3.87–5.11)
RDW: 15.2 % (ref 11.5–15.5)
WBC: 7.1 10*3/uL (ref 4.0–10.5)
nRBC: 0 % (ref 0.0–0.2)

## 2019-12-11 LAB — BASIC METABOLIC PANEL
Anion gap: 7 (ref 5–15)
BUN: 21 mg/dL — ABNORMAL HIGH (ref 6–20)
CO2: 25 mmol/L (ref 22–32)
Calcium: 9.2 mg/dL (ref 8.9–10.3)
Chloride: 107 mmol/L (ref 98–111)
Creatinine, Ser: 0.8 mg/dL (ref 0.44–1.00)
GFR calc Af Amer: >60 mL/min (ref >60)
GFR calc non Af Amer: >60 mL/min (ref >60)
Glucose, Bld: 106 mg/dL — ABNORMAL HIGH (ref 70–99)
Potassium: 3.6 mmol/L (ref 3.5–5.1)
Sodium: 139 mmol/L (ref 135–145)

## 2019-12-11 LAB — TROPONIN I (HIGH SENSITIVITY): Troponin I (High Sensitivity): 4 ng/L (ref <18)

## 2019-12-11 NOTE — ED Notes (Signed)
Pt transported to xray 

## 2019-12-11 NOTE — ED Triage Notes (Signed)
Pt to ED from home c/o palpitations x1 hr PTA, states sometimes happens but will resolve quickly.  Denies pain but states some SOB.  Pt chest rise even and unlabored, skin warm and dry, speaking in complete and coherent sentences, in NAD at this time.

## 2019-12-12 ENCOUNTER — Telehealth: Payer: Self-pay | Admitting: Emergency Medicine

## 2019-12-12 ENCOUNTER — Emergency Department
Admission: EM | Admit: 2019-12-12 | Discharge: 2019-12-12 | Disposition: A | Discharge status: Home / Self Care | Payer: Self-pay | Attending: Emergency Medicine | Admitting: Emergency Medicine

## 2019-12-12 NOTE — ED Notes (Signed)
No answer when called several times from lobby 

## 2019-12-12 NOTE — Telephone Encounter (Signed)
Called patient due to lwot to inquire about condition and follow up plans.  No answer and no voicemail  

## 2020-02-18 ENCOUNTER — Emergency Department: Payer: Self-pay

## 2020-02-18 ENCOUNTER — Encounter: Payer: Self-pay | Admitting: Emergency Medicine

## 2020-02-18 ENCOUNTER — Other Ambulatory Visit: Payer: Self-pay

## 2020-02-18 ENCOUNTER — Emergency Department
Admission: EM | Admit: 2020-02-18 | Discharge: 2020-02-18 | Disposition: A | Discharge status: Home / Self Care | Payer: Self-pay | Attending: Emergency Medicine | Admitting: Emergency Medicine

## 2020-02-18 DIAGNOSIS — Z79899 Other long term (current) drug therapy: Secondary | ICD-10-CM | POA: Insufficient documentation

## 2020-02-18 DIAGNOSIS — R079 Chest pain, unspecified: Secondary | ICD-10-CM | POA: Insufficient documentation

## 2020-02-18 DIAGNOSIS — Z87891 Personal history of nicotine dependence: Secondary | ICD-10-CM | POA: Insufficient documentation

## 2020-02-18 LAB — CBC
HCT: 41.2 % (ref 36.0–46.0)
Hemoglobin: 13.6 g/dL (ref 12.0–15.0)
MCH: 28.2 pg (ref 26.0–34.0)
MCHC: 33 g/dL (ref 30.0–36.0)
MCV: 85.5 fL (ref 80.0–100.0)
Platelets: 250 10*3/uL (ref 150–400)
RBC: 4.82 MIL/uL (ref 3.87–5.11)
RDW: 14.2 % (ref 11.5–15.5)
WBC: 6.4 10*3/uL (ref 4.0–10.5)
nRBC: 0 % (ref 0.0–0.2)

## 2020-02-18 LAB — BASIC METABOLIC PANEL
Anion gap: 6 (ref 5–15)
BUN: 25 mg/dL — ABNORMAL HIGH (ref 6–20)
CO2: 22 mmol/L (ref 22–32)
Calcium: 8.8 mg/dL — ABNORMAL LOW (ref 8.9–10.3)
Chloride: 110 mmol/L (ref 98–111)
Creatinine, Ser: 0.81 mg/dL (ref 0.44–1.00)
GFR calc Af Amer: >60 mL/min (ref >60)
GFR calc non Af Amer: >60 mL/min (ref >60)
Glucose, Bld: 110 mg/dL — ABNORMAL HIGH (ref 70–99)
Potassium: 3.8 mmol/L (ref 3.5–5.1)
Sodium: 138 mmol/L (ref 135–145)

## 2020-02-18 LAB — TROPONIN I (HIGH SENSITIVITY)
Troponin I (High Sensitivity): 4 ng/L (ref <18)
Troponin I (High Sensitivity): 3 ng/L (ref <18)

## 2020-02-18 NOTE — ED Provider Notes (Signed)
[facility] Emergency Department Provider Note   ____________________________________________   First MD Initiated Contact with Patient 02/18/20 0606     (approximate)  I have reviewed the triage vital signs and the nursing notes.   HISTORY  Chief Complaint Chest Pain    HPI Amy Velez is a 49 y.o. female who presents to the ED from home with a chief complaint of chest pain.  Patient was at an escape room earlier and had to run on a hamster wheel.  When she got home she felt short of breath with chest tightness and heart racing.  All symptoms have since resolved but she wanted to be evaluated.  Denies fever, cough, abdominal pain, nausea, vomiting or dizziness.  Denies recent travel, trauma or hormone use.       Past medical history None   Patient Active Problem List   Diagnosis Date Noted  . Nonintractable headache 04/27/2018  . Tobacco abuse 01/06/2018    Past Surgical History:  Procedure Laterality Date  . CESAREAN SECTION    . TUBAL LIGATION      Prior to Admission medications   Medication Sig Start Date End Date Taking? Authorizing Provider  miconazole (MICOTIN) 2 % cream Apply 1 application topically 2 (two) times daily. 09/23/18   Trinna Post, PA-C  ondansetron (ZOFRAN) 4 MG tablet Take 1 tablet (4 mg total) by mouth every 8 (eight) hours as needed for nausea or vomiting. 01/13/19   Trinna Post, PA-C    Allergies Patient has no known allergies.  Family History  Problem Relation Age of Onset  . Diabetes Mother   . Liver cancer Father     Social History Social History   Tobacco Use  . Smoking status: Former Smoker    Packs/day: 1.00    Types: Cigarettes  . Smokeless tobacco: Never Used  . Tobacco comment: Black and milds  Substance Use Topics  . Alcohol use: No    Comment: occ.  . Drug use: No    Review of Systems  Constitutional: No fever/chills Eyes: No visual changes. ENT: No sore  throat. Cardiovascular: Positive for chest pain. Respiratory: Positive for shortness of breath. Gastrointestinal: No abdominal pain.  No nausea, no vomiting.  No diarrhea.  No constipation. Genitourinary: Negative for dysuria. Musculoskeletal: Negative for back pain. Skin: Negative for rash. Neurological: Negative for headaches, focal weakness or numbness.   ____________________________________________   PHYSICAL EXAM:  VITAL SIGNS: ED Triage Vitals  Enc Vitals Group     BP 02/18/20 0405 (!) 142/84     Pulse Rate 02/18/20 0405 67     Resp 02/18/20 0405 18     Temp 02/18/20 0405 98.7 F (37.1 C)     Temp Source 02/18/20 0405 Oral     SpO2 02/18/20 0405 98 %     Weight 02/18/20 0403 185 lb (83.9 kg)     Height 02/18/20 0403 5\' 7"  (1.702 m)     Head Circumference --      Peak Flow --      Pain Score 02/18/20 0403 7     Pain Loc --      Pain Edu? --      Excl. in [place]? --     Constitutional: Alert and oriented. Well appearing and in no acute distress. Eyes: Conjunctivae are normal. PERRL. EOMI. Head: Atraumatic. Nose: No congestion/rhinnorhea. Mouth/Throat: Mucous membranes are moist.  Oropharynx non-erythematous. Neck: No stridor.   Cardiovascular: Normal rate, regular rhythm. Grossly normal  heart sounds.  Good peripheral circulation. Respiratory: Normal respiratory effort.  No retractions. Lungs CTAB. Gastrointestinal: Soft and nontender. No distention. No abdominal bruits. No CVA tenderness. Musculoskeletal: No lower extremity tenderness nor edema.  No joint effusions. Neurologic:  Normal speech and language. No gross focal neurologic deficits are appreciated. No gait instability. Skin:  Skin is warm, dry and intact. No rash noted. Psychiatric: Mood and affect are normal. Speech and behavior are normal.  ____________________________________________   LABS (all labs ordered are listed, but only abnormal results are displayed)  Labs Reviewed  BASIC METABOLIC PANEL -  Abnormal; Notable for the following components:      Result Value   Glucose, Bld 110 (*)    BUN 25 (*)    Calcium 8.8 (*)    All other components within normal limits  CBC  POC URINE PREG, ED  TROPONIN I (HIGH SENSITIVITY)  TROPONIN I (HIGH SENSITIVITY)   ____________________________________________  EKG  ED ECG REPORT I, Ama Mcmaster J, the attending physician, personally viewed and interpreted this ECG.   Date: 02/18/2020  EKG Time: 0358  Rate: 68  Rhythm: normal EKG, normal sinus rhythm  Axis: Normal  Intervals:none  ST&T Change: Nonspecific  ____________________________________________  RADIOLOGY  ED MD interpretation: No acute cardiopulmonary process  Official radiology report(s): DG Chest 2 View  Result Date: 02/18/2020 CLINICAL DATA:  c/o chest pain x 1 hour. Pt reports that she went to an escape room earlier and was running on a big hamster wheel and has not fully caught her breath since that timechest pain EXAM: CHEST - 2 VIEW COMPARISON:  07/31/2017 FINDINGS: Normal mediastinum and cardiac silhouette. Normal pulmonary vasculature. No evidence of effusion, infiltrate, or pneumothorax. No acute bony abnormality. IMPRESSION: No acute cardiopulmonary process. Electronically Signed   By: Genevive Bi M.D.   On: 02/18/2020 04:53    ____________________________________________   PROCEDURES  Procedure(s) performed (including Critical Care):  Procedures   ____________________________________________   INITIAL IMPRESSION / ASSESSMENT AND PLAN / ED COURSE  As part of my medical decision making, I reviewed the following data within the electronic MEDICAL RECORD NUMBER Nursing notes reviewed and incorporated, Labs reviewed, EKG interpreted, Old chart reviewed, Radiograph reviewed and Notes from prior ED visits     Amy Velez was evaluated in Emergency Department on 02/18/2020 for the symptoms described in the history of present illness. She was evaluated in the  context of the global COVID-19 pandemic, which necessitated consideration that the patient might be at risk for infection with the SARS-CoV-2 virus that causes COVID-19. Institutional protocols and algorithms that pertain to the evaluation of patients at risk for COVID-19 are in a state of rapid change based on information released by regulatory bodies including the CDC and federal and state organizations. These policies and algorithms were followed during the patient's care in the ED.    49 year old female presenting with chest pain and shortness of breath after running on a hamster wheel at an escape room. Differential diagnosis includes, but is not limited to, ACS, aortic dissection, pulmonary embolism, cardiac tamponade, pneumothorax, pneumonia, pericarditis, myocarditis, GI-related causes including esophagitis/gastritis, and musculoskeletal chest wall pain.    I have personally reviewed patient's old records, current EKG, chest x-ray and lab work.  Initial troponin and EKG are unremarkable.  Will repeat times troponin.  All symptoms have resolved and patient currently voicing no complaints.   Clinical Course as of Feb 18 707  Sun Feb 18, 2020  0707 Repeat troponin unremarkable.  Strict return  precautions given.  Patient verbalizes understanding and agrees with plan of care.   [JS]    Clinical Course User Index [JS] Irean Hong, MD     ____________________________________________   FINAL CLINICAL IMPRESSION(S) / ED DIAGNOSES  Final diagnoses:  Nonspecific chest pain     ED Discharge Orders    None       Note:  This document was prepared using Dragon voice recognition software and may include unintentional dictation errors.   Irean Hong, MD 02/18/20 [phone]

## 2020-02-18 NOTE — ED Triage Notes (Signed)
Pt arrives POV and ambulatory to triage with c/o chest pain x 1 hour. Pt reports that she went to an escape room earlier and was running on a big hamster wheel and has not fully caught her breath since that time.

## 2020-02-18 NOTE — Discharge Instructions (Signed)
Return to the ER for worsening symptoms, persistent vomiting, difficulty breathing or other concerns. °

## 2020-09-08 IMAGING — CR DG CHEST 2V
1 series · 2 of 2 positions shown · non-contrast
Comparison: 07/31/2017

CLINICAL DATA: c/o chest pain x 1 hour. Pt reports that she went to
an escape room earlier and was running on a big hamster wheel and
has not fully caught her breath since that timechest pain

EXAM:
CHEST - 2 VIEW

[Series 1: dg chest 2 view · 0.14mm/px · 2 of 2 slices shown]
[im 1/2]
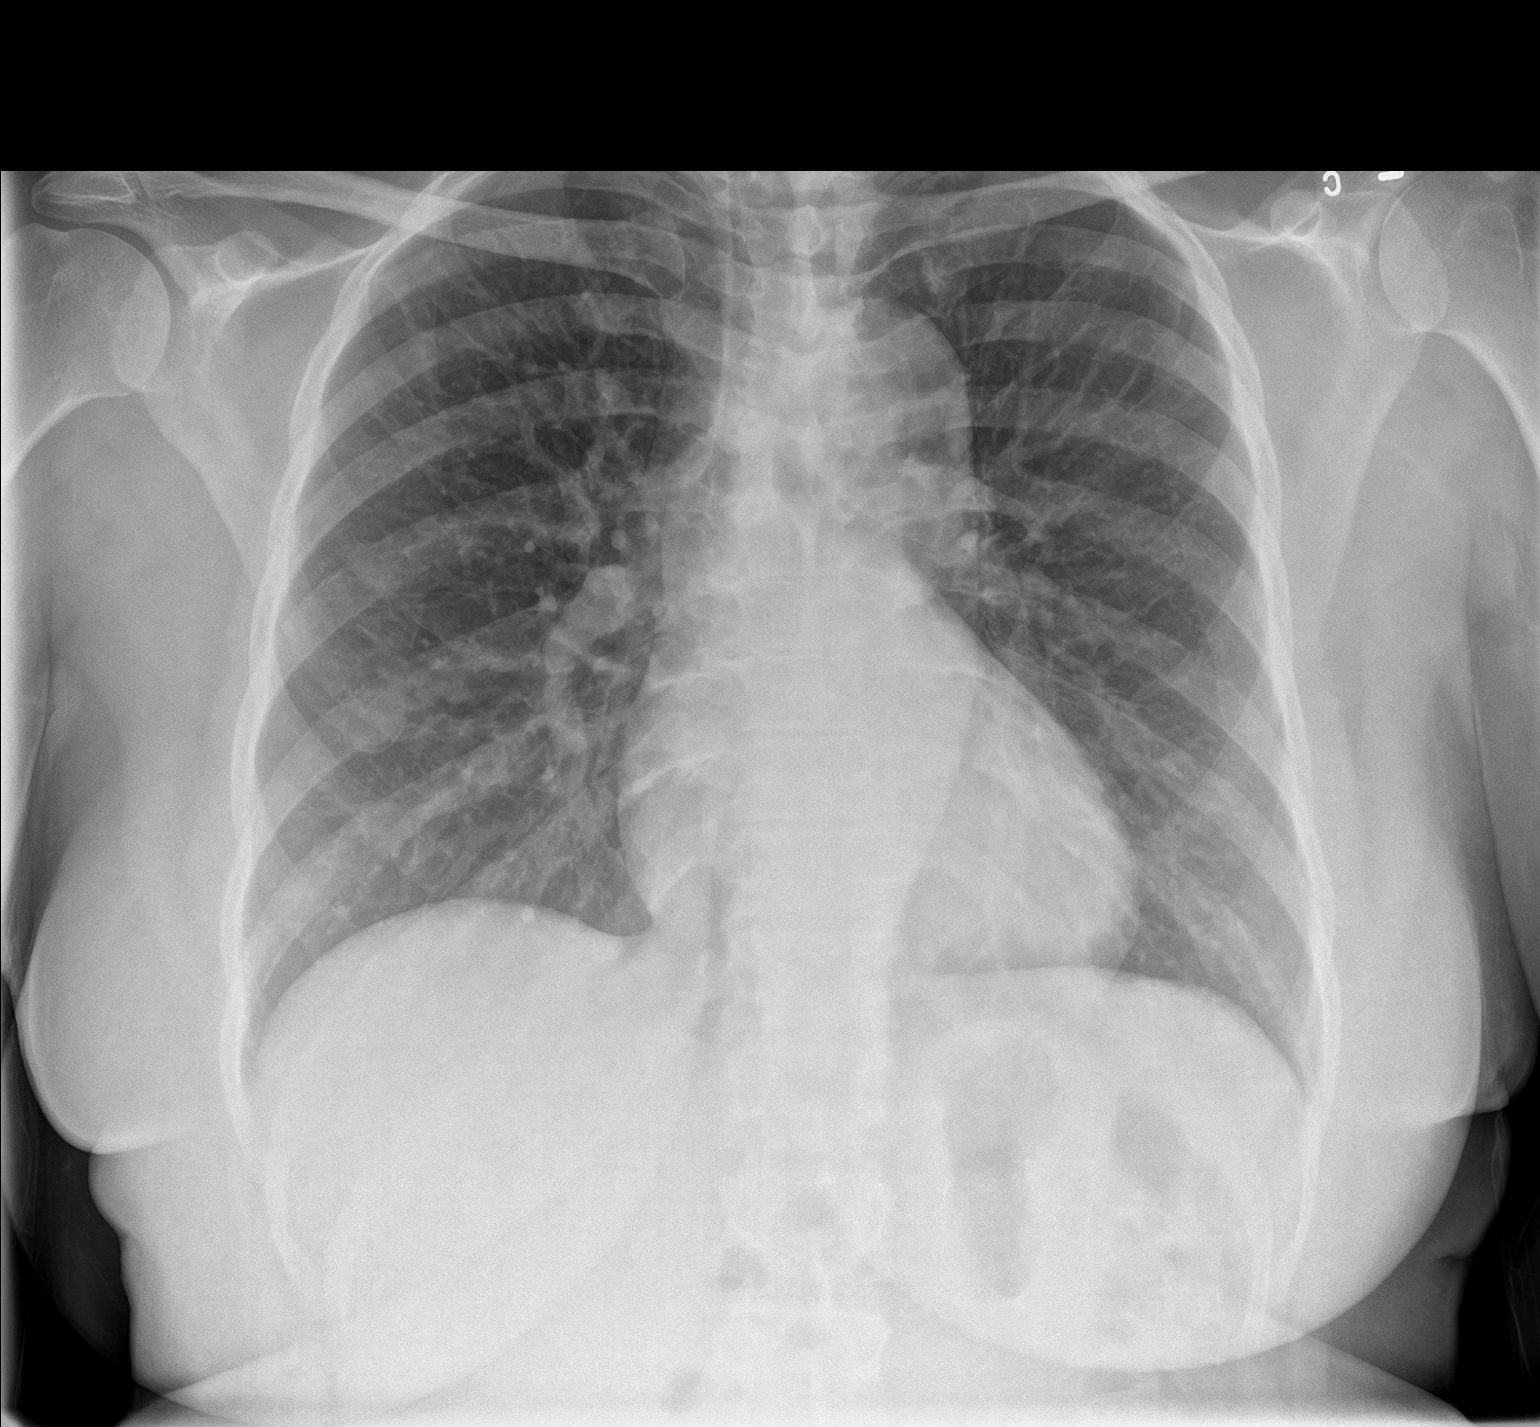
[im 2/2]
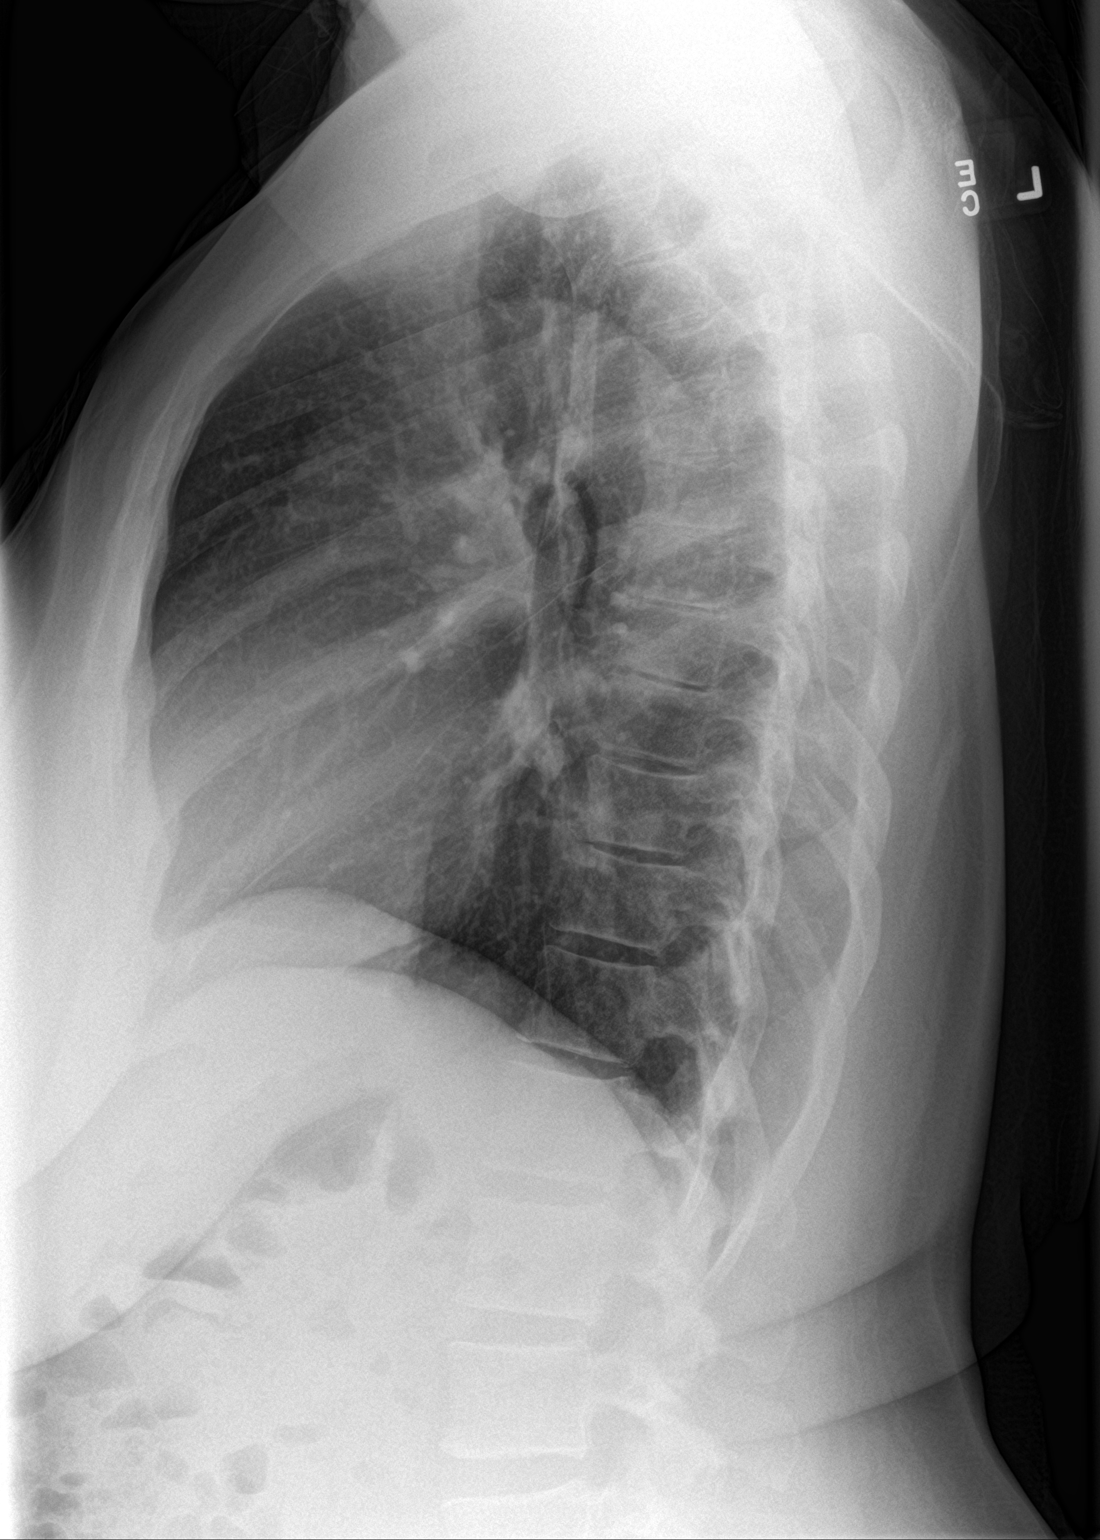

[2 of 2 positions shown; findings below may reference images not displayed]

FINDINGS: Normal mediastinum and cardiac silhouette. Normal pulmonary
vasculature. No evidence of effusion, infiltrate, or pneumothorax.
No acute bony abnormality.
IMPRESSION: No acute cardiopulmonary process.
